# Patient Record
Sex: Female | Born: 1985 | Race: White | Hispanic: No | Marital: Married
Health system: Southern US, Community
[De-identification: ages and names within clinical notes are randomized; demographics above are authoritative.]

---

## 2018-07-31 ENCOUNTER — Emergency Department (HOSPITAL_COMMUNITY): Payer: BC Managed Care – PPO

## 2018-07-31 ENCOUNTER — Other Ambulatory Visit: Payer: Self-pay

## 2018-07-31 ENCOUNTER — Emergency Department (HOSPITAL_COMMUNITY)
Admission: EM | Admit: 2018-07-31 | Discharge: 2018-07-31 | Disposition: A | Discharge status: Home / Self Care | Payer: BC Managed Care – PPO | Attending: Emergency Medicine | Admitting: Emergency Medicine

## 2018-07-31 DIAGNOSIS — Y999 Unspecified external cause status: Secondary | ICD-10-CM | POA: Diagnosis not present

## 2018-07-31 DIAGNOSIS — R0789 Other chest pain: Secondary | ICD-10-CM | POA: Diagnosis present

## 2018-07-31 DIAGNOSIS — Y929 Unspecified place or not applicable: Secondary | ICD-10-CM | POA: Insufficient documentation

## 2018-07-31 DIAGNOSIS — Y9389 Activity, other specified: Secondary | ICD-10-CM | POA: Diagnosis not present

## 2018-07-31 DIAGNOSIS — S63622A Sprain of interphalangeal joint of left thumb, initial encounter: Secondary | ICD-10-CM

## 2018-07-31 DIAGNOSIS — S63631A Sprain of interphalangeal joint of left index finger, initial encounter: Secondary | ICD-10-CM | POA: Insufficient documentation

## 2018-07-31 LAB — POC URINE PREG, ED: Preg Test, Ur: NEGATIVE
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
## 2018-07-31 NOTE — Progress Notes (Signed)
Orthopedic Tech Progress Note Patient Details:  Anne Andersen 06/19/1985 [id number]  Ortho Devices Type of Ortho Device: Thumb velcro splint Ortho Device/Splint Interventions: Application   Post Interventions Patient Tolerated: Well Instructions Provided: Care of device   Saul FordyceJennifer C Amayrani Bennick 07/31/2018, 5:18 PM

## 2018-07-31 NOTE — Discharge Instructions (Signed)
Wear splint until follow up recheck with orthopedics. Apply ice for 20 minutes at a time, elevate hand to help with pain and swelling. Motrin and Tylenol as needed as directed for pain.
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
## 2019-08-21 DIAGNOSIS — Z6825 Body mass index (BMI) 25.0-25.9, adult: Secondary | ICD-10-CM | POA: Diagnosis not present

## 2019-08-21 DIAGNOSIS — N63 Unspecified lump in unspecified breast: Secondary | ICD-10-CM | POA: Diagnosis not present

## 2019-08-25 ENCOUNTER — Other Ambulatory Visit: Payer: Self-pay | Admitting: Obstetrics and Gynecology

## 2019-08-25 DIAGNOSIS — N6320 Unspecified lump in the left breast, unspecified quadrant: Secondary | ICD-10-CM

## 2019-09-02 ENCOUNTER — Ambulatory Visit
Admission: RE | Admit: 2019-09-02 | Discharge: 2019-09-02 | Disposition: A | Discharge status: Home / Self Care | Payer: BC Managed Care – PPO | Source: Ambulatory Visit | Attending: Obstetrics and Gynecology | Admitting: Obstetrics and Gynecology

## 2019-09-02 ENCOUNTER — Other Ambulatory Visit: Payer: Self-pay | Admitting: Obstetrics and Gynecology

## 2019-09-02 ENCOUNTER — Other Ambulatory Visit: Payer: Self-pay

## 2019-09-02 DIAGNOSIS — N6320 Unspecified lump in the left breast, unspecified quadrant: Secondary | ICD-10-CM

## 2019-09-02 DIAGNOSIS — N6489 Other specified disorders of breast: Secondary | ICD-10-CM | POA: Diagnosis not present

## 2019-09-02 DIAGNOSIS — N6451 Induration of breast: Secondary | ICD-10-CM | POA: Diagnosis not present

## 2019-09-02 DIAGNOSIS — R928 Other abnormal and inconclusive findings on diagnostic imaging of breast: Secondary | ICD-10-CM | POA: Diagnosis not present

## 2019-09-09 ENCOUNTER — Ambulatory Visit
Admission: RE | Admit: 2019-09-09 | Discharge: 2019-09-09 | Disposition: A | Discharge status: Home / Self Care | Payer: BC Managed Care – PPO | Source: Ambulatory Visit | Attending: Obstetrics and Gynecology | Admitting: Obstetrics and Gynecology

## 2019-09-09 ENCOUNTER — Other Ambulatory Visit: Payer: Self-pay

## 2019-09-09 DIAGNOSIS — N6320 Unspecified lump in the left breast, unspecified quadrant: Secondary | ICD-10-CM

## 2019-09-09 DIAGNOSIS — D242 Benign neoplasm of left breast: Secondary | ICD-10-CM | POA: Diagnosis not present

## 2019-09-09 DIAGNOSIS — N6341 Unspecified lump in right breast, subareolar: Secondary | ICD-10-CM | POA: Diagnosis not present

## 2019-09-09 DIAGNOSIS — N6324 Unspecified lump in the left breast, lower inner quadrant: Secondary | ICD-10-CM | POA: Diagnosis not present

## 2019-10-19 DIAGNOSIS — M531 Cervicobrachial syndrome: Secondary | ICD-10-CM | POA: Diagnosis not present

## 2019-10-19 DIAGNOSIS — M9901 Segmental and somatic dysfunction of cervical region: Secondary | ICD-10-CM | POA: Diagnosis not present

## 2019-10-19 DIAGNOSIS — M9902 Segmental and somatic dysfunction of thoracic region: Secondary | ICD-10-CM | POA: Diagnosis not present

## 2019-10-19 DIAGNOSIS — M791 Myalgia, unspecified site: Secondary | ICD-10-CM | POA: Diagnosis not present

## 2019-10-26 DIAGNOSIS — M9902 Segmental and somatic dysfunction of thoracic region: Secondary | ICD-10-CM | POA: Diagnosis not present

## 2019-10-26 DIAGNOSIS — M791 Myalgia, unspecified site: Secondary | ICD-10-CM | POA: Diagnosis not present

## 2019-10-26 DIAGNOSIS — M531 Cervicobrachial syndrome: Secondary | ICD-10-CM | POA: Diagnosis not present

## 2019-10-26 DIAGNOSIS — M9901 Segmental and somatic dysfunction of cervical region: Secondary | ICD-10-CM | POA: Diagnosis not present

## 2019-11-09 DIAGNOSIS — M791 Myalgia, unspecified site: Secondary | ICD-10-CM | POA: Diagnosis not present

## 2019-11-09 DIAGNOSIS — M9901 Segmental and somatic dysfunction of cervical region: Secondary | ICD-10-CM | POA: Diagnosis not present

## 2019-11-09 DIAGNOSIS — M9902 Segmental and somatic dysfunction of thoracic region: Secondary | ICD-10-CM | POA: Diagnosis not present

## 2019-11-09 DIAGNOSIS — M531 Cervicobrachial syndrome: Secondary | ICD-10-CM | POA: Diagnosis not present

## 2019-12-04 DIAGNOSIS — M9902 Segmental and somatic dysfunction of thoracic region: Secondary | ICD-10-CM | POA: Diagnosis not present

## 2019-12-04 DIAGNOSIS — M9901 Segmental and somatic dysfunction of cervical region: Secondary | ICD-10-CM | POA: Diagnosis not present

## 2019-12-04 DIAGNOSIS — M791 Myalgia, unspecified site: Secondary | ICD-10-CM | POA: Diagnosis not present

## 2019-12-04 DIAGNOSIS — M531 Cervicobrachial syndrome: Secondary | ICD-10-CM | POA: Diagnosis not present

## 2019-12-31 ENCOUNTER — Ambulatory Visit: Payer: PRIVATE HEALTH INSURANCE | Attending: Internal Medicine

## 2019-12-31 DIAGNOSIS — Z20822 Contact with and (suspected) exposure to covid-19: Secondary | ICD-10-CM

## 2019-12-31 DIAGNOSIS — Z20828 Contact with and (suspected) exposure to other viral communicable diseases: Secondary | ICD-10-CM | POA: Insufficient documentation

## 2020-01-02 LAB — NOVEL CORONAVIRUS, NAA: SARS-CoV-2, NAA: NOT DETECTED

## 2020-01-04 ENCOUNTER — Other Ambulatory Visit: Payer: BC Managed Care – PPO

## 2020-01-11 ENCOUNTER — Ambulatory Visit: Payer: PRIVATE HEALTH INSURANCE | Attending: Internal Medicine

## 2020-01-11 DIAGNOSIS — U071 COVID-19: Secondary | ICD-10-CM | POA: Insufficient documentation

## 2020-01-11 DIAGNOSIS — Z20822 Contact with and (suspected) exposure to covid-19: Secondary | ICD-10-CM

## 2020-01-12 LAB — NOVEL CORONAVIRUS, NAA: SARS-CoV-2, NAA: DETECTED — AB

## 2020-01-25 ENCOUNTER — Other Ambulatory Visit: Payer: Self-pay | Admitting: Obstetrics and Gynecology

## 2020-01-25 DIAGNOSIS — N6452 Nipple discharge: Secondary | ICD-10-CM | POA: Diagnosis not present

## 2020-01-25 DIAGNOSIS — Z6826 Body mass index (BMI) 26.0-26.9, adult: Secondary | ICD-10-CM | POA: Diagnosis not present

## 2020-01-26 ENCOUNTER — Other Ambulatory Visit: Payer: Self-pay | Admitting: Obstetrics and Gynecology

## 2020-01-26 DIAGNOSIS — N6452 Nipple discharge: Secondary | ICD-10-CM

## 2020-01-29 DIAGNOSIS — R947 Abnormal results of other endocrine function studies: Secondary | ICD-10-CM | POA: Diagnosis not present

## 2020-02-15 ENCOUNTER — Ambulatory Visit: Payer: Self-pay

## 2020-02-15 ENCOUNTER — Other Ambulatory Visit: Payer: Self-pay | Admitting: Obstetrics and Gynecology

## 2020-02-15 ENCOUNTER — Ambulatory Visit
Admission: RE | Admit: 2020-02-15 | Discharge: 2020-02-15 | Disposition: A | Discharge status: Home / Self Care | Payer: Self-pay | Source: Ambulatory Visit | Attending: Obstetrics and Gynecology | Admitting: Obstetrics and Gynecology

## 2020-02-15 ENCOUNTER — Ambulatory Visit
Admission: RE | Admit: 2020-02-15 | Discharge: 2020-02-15 | Disposition: A | Discharge status: Home / Self Care | Payer: PRIVATE HEALTH INSURANCE | Source: Ambulatory Visit | Attending: Obstetrics and Gynecology | Admitting: Obstetrics and Gynecology

## 2020-02-15 ENCOUNTER — Other Ambulatory Visit: Payer: Self-pay

## 2020-02-15 DIAGNOSIS — R922 Inconclusive mammogram: Secondary | ICD-10-CM | POA: Diagnosis not present

## 2020-02-15 DIAGNOSIS — N6452 Nipple discharge: Secondary | ICD-10-CM

## 2020-02-15 DIAGNOSIS — N6011 Diffuse cystic mastopathy of right breast: Secondary | ICD-10-CM | POA: Diagnosis not present

## 2020-02-15 DIAGNOSIS — N6041 Mammary duct ectasia of right breast: Secondary | ICD-10-CM | POA: Diagnosis not present

## 2020-02-29 DIAGNOSIS — N6452 Nipple discharge: Secondary | ICD-10-CM | POA: Diagnosis not present

## 2020-02-29 DIAGNOSIS — R5383 Other fatigue: Secondary | ICD-10-CM | POA: Diagnosis not present

## 2020-02-29 DIAGNOSIS — D242 Benign neoplasm of left breast: Secondary | ICD-10-CM | POA: Diagnosis not present

## 2020-02-29 DIAGNOSIS — N898 Other specified noninflammatory disorders of vagina: Secondary | ICD-10-CM | POA: Diagnosis not present

## 2020-02-29 DIAGNOSIS — R635 Abnormal weight gain: Secondary | ICD-10-CM | POA: Diagnosis not present

## 2020-02-29 DIAGNOSIS — L853 Xerosis cutis: Secondary | ICD-10-CM | POA: Diagnosis not present

## 2020-03-03 ENCOUNTER — Other Ambulatory Visit: Payer: Self-pay | Admitting: Surgery

## 2020-03-03 DIAGNOSIS — N6452 Nipple discharge: Secondary | ICD-10-CM

## 2020-03-07 ENCOUNTER — Other Ambulatory Visit: Payer: PRIVATE HEALTH INSURANCE

## 2020-03-15 ENCOUNTER — Inpatient Hospital Stay: Admission: RE | Admit: 2020-03-15 | Payer: PRIVATE HEALTH INSURANCE | Source: Ambulatory Visit

## 2020-04-10 DIAGNOSIS — S8011XA Contusion of right lower leg, initial encounter: Secondary | ICD-10-CM | POA: Diagnosis not present

## 2020-04-14 DIAGNOSIS — N898 Other specified noninflammatory disorders of vagina: Secondary | ICD-10-CM | POA: Diagnosis not present

## 2020-04-14 DIAGNOSIS — Z6826 Body mass index (BMI) 26.0-26.9, adult: Secondary | ICD-10-CM | POA: Diagnosis not present

## 2020-04-14 DIAGNOSIS — N762 Acute vulvitis: Secondary | ICD-10-CM | POA: Diagnosis not present

## 2020-08-11 IMAGING — MG MM BREAST LOCALIZATION CLIP
4 series · 4 of 12 positions shown · non-contrast
Comparison: Previous exam(s).

CLINICAL DATA: Status post ultrasound-guided core needle biopsy of
the recently demonstrated 1.8 cm mass in the 4 o'clock position of
the left breast

EXAM:
DIAGNOSTIC LEFT MAMMOGRAM POST ULTRASOUND BIOPSY

[L ML synth-2D]
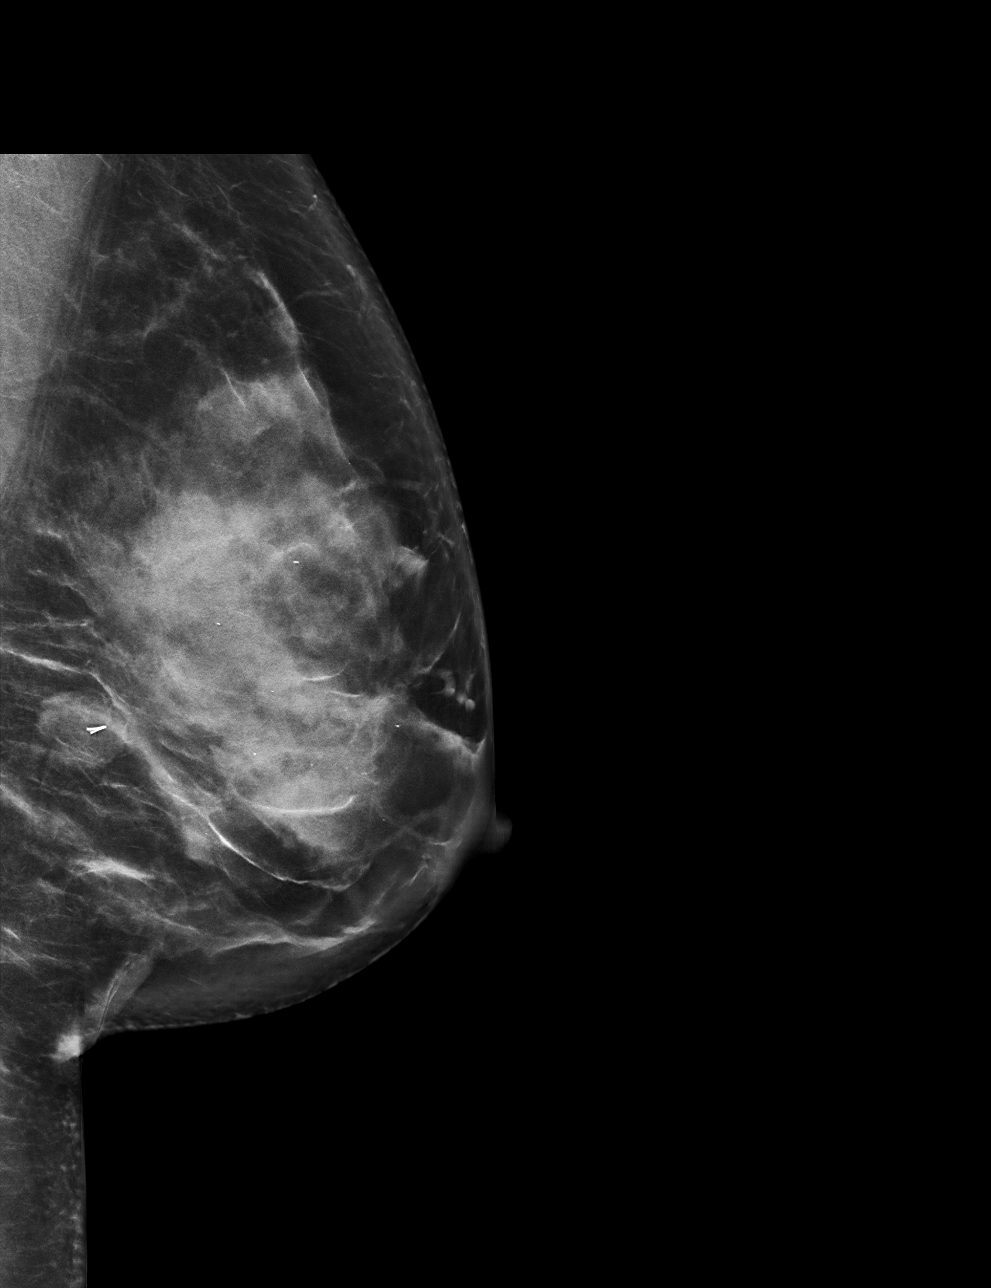

[L CC synth-2D]
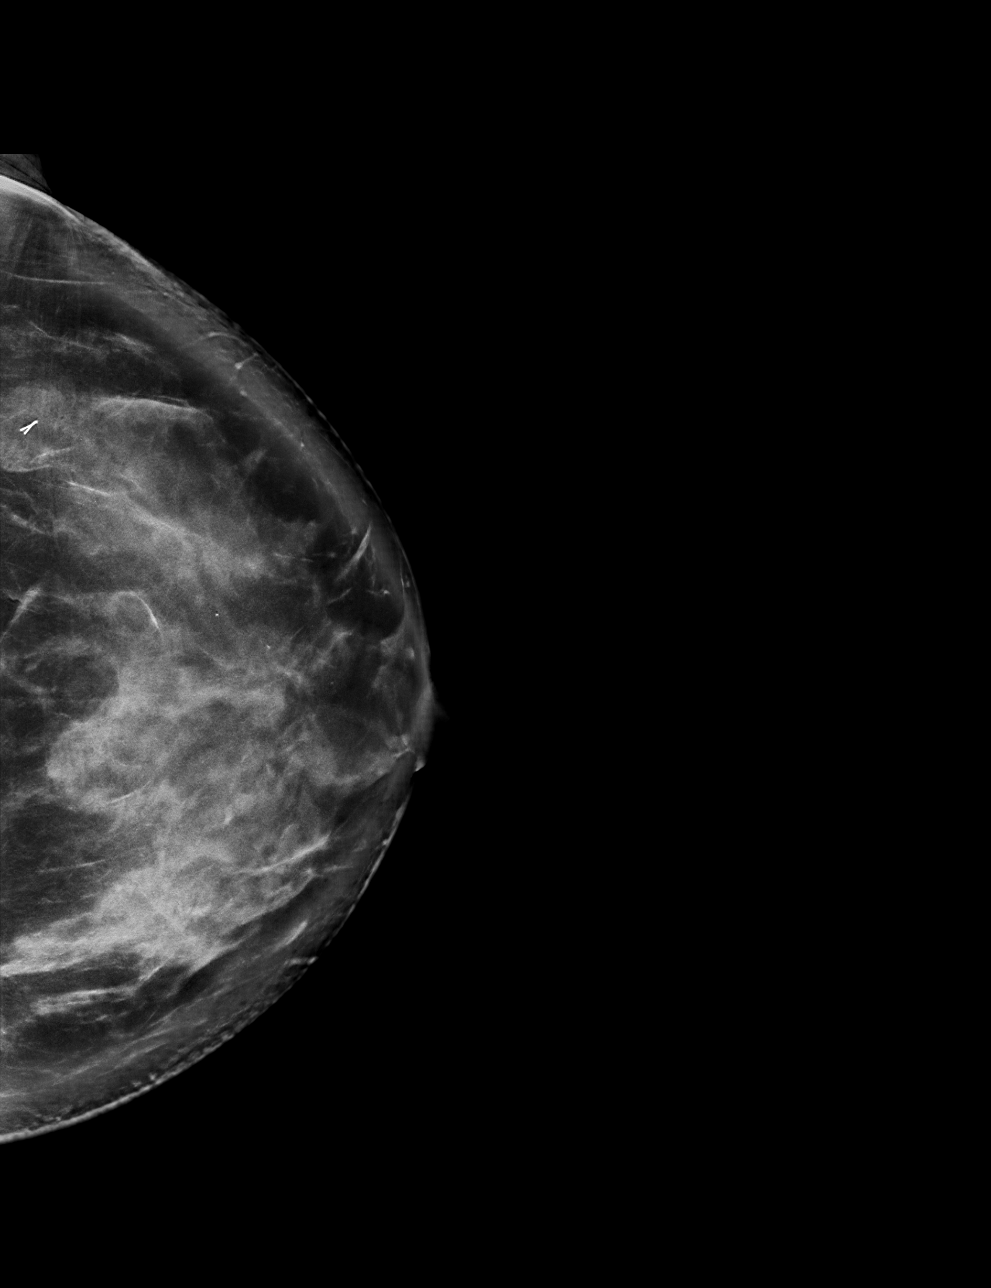

[L CC tomo · tomo slice 48/95.0]
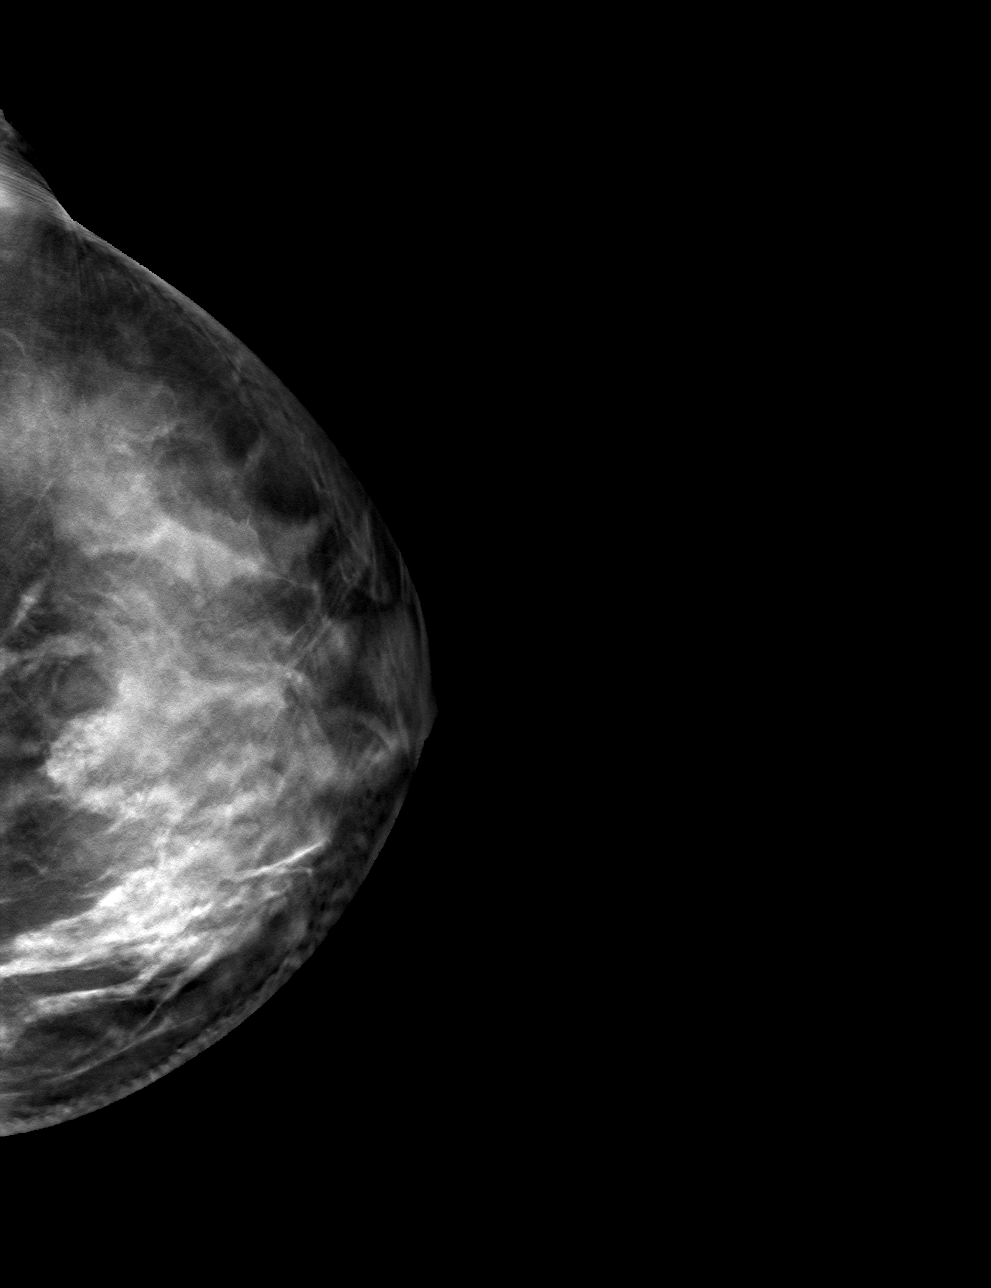

[L ML tomo · tomo slice 44/87.0]
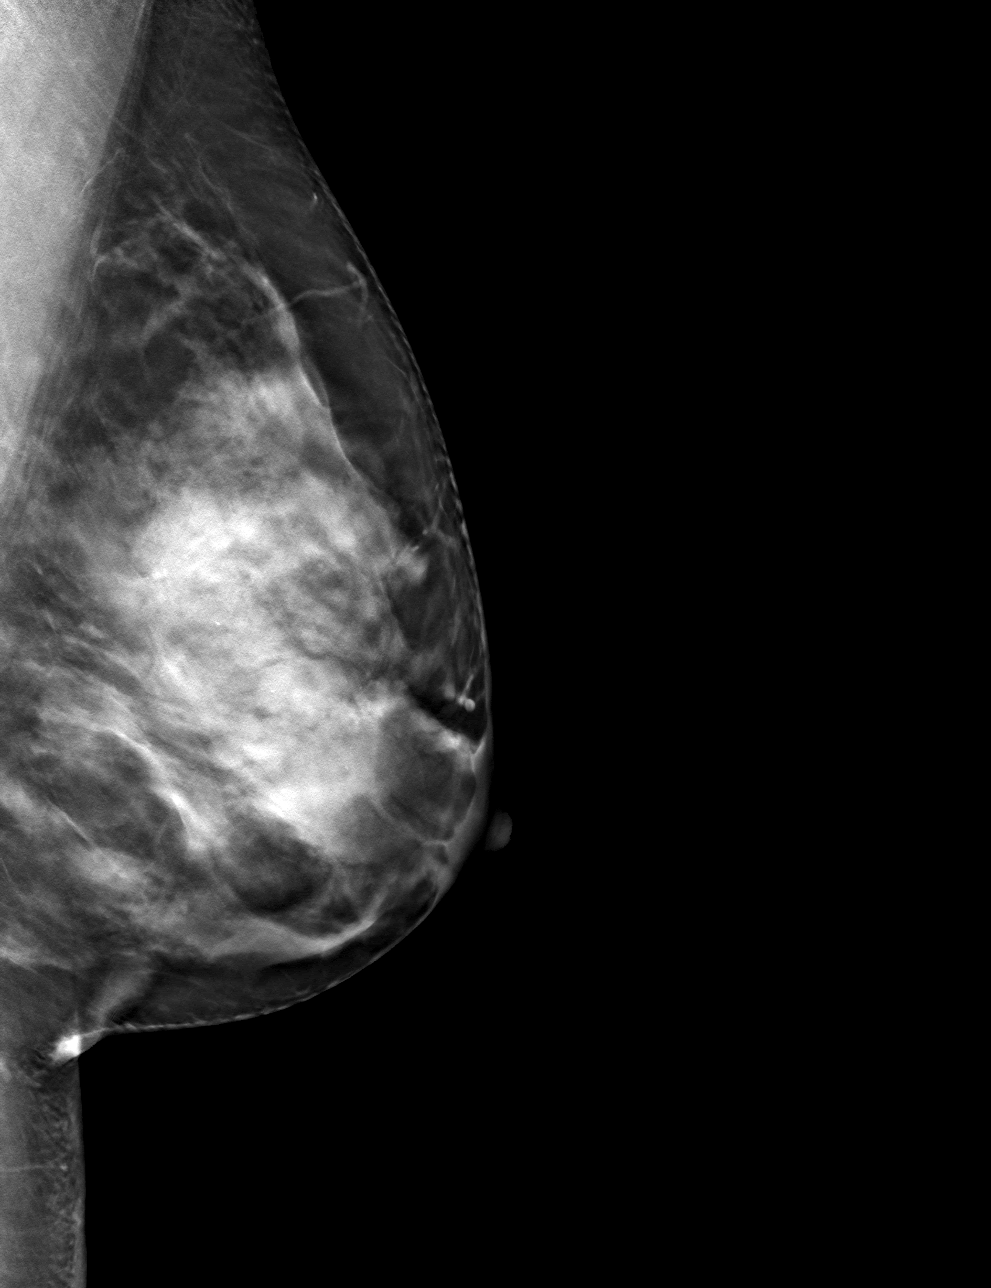

[4 of 12 positions shown; findings below may reference images not displayed]

FINDINGS: Mammographic images were obtained following ultrasound guided biopsy
of the recently demonstrated 1.8 cm mass in the 4 o'clock position
of the left breast. These demonstrate a ribbon shaped biopsy marker
clip within the biopsied mass.
IMPRESSION: Appropriate clip deployment following left breast ultrasound-guided
core needle biopsy.

Final Assessment: Post Procedure Mammograms for Marker Placement

## 2020-08-26 DIAGNOSIS — Z6826 Body mass index (BMI) 26.0-26.9, adult: Secondary | ICD-10-CM | POA: Diagnosis not present

## 2020-08-26 DIAGNOSIS — N644 Mastodynia: Secondary | ICD-10-CM | POA: Diagnosis not present

## 2020-08-29 DIAGNOSIS — M9902 Segmental and somatic dysfunction of thoracic region: Secondary | ICD-10-CM | POA: Diagnosis not present

## 2020-08-29 DIAGNOSIS — M531 Cervicobrachial syndrome: Secondary | ICD-10-CM | POA: Diagnosis not present

## 2020-08-29 DIAGNOSIS — M9901 Segmental and somatic dysfunction of cervical region: Secondary | ICD-10-CM | POA: Diagnosis not present

## 2020-08-29 DIAGNOSIS — M791 Myalgia, unspecified site: Secondary | ICD-10-CM | POA: Diagnosis not present

## 2020-09-02 ENCOUNTER — Other Ambulatory Visit: Payer: Self-pay

## 2020-09-02 ENCOUNTER — Ambulatory Visit
Admission: RE | Admit: 2020-09-02 | Discharge: 2020-09-02 | Disposition: A | Discharge status: Home / Self Care | Payer: PRIVATE HEALTH INSURANCE | Source: Ambulatory Visit | Attending: Obstetrics and Gynecology | Admitting: Obstetrics and Gynecology

## 2020-09-02 DIAGNOSIS — N6452 Nipple discharge: Secondary | ICD-10-CM

## 2020-10-27 DIAGNOSIS — Z20822 Contact with and (suspected) exposure to covid-19: Secondary | ICD-10-CM | POA: Diagnosis not present

## 2020-10-27 DIAGNOSIS — Z03818 Encounter for observation for suspected exposure to other biological agents ruled out: Secondary | ICD-10-CM | POA: Diagnosis not present

## 2020-11-07 DIAGNOSIS — Z131 Encounter for screening for diabetes mellitus: Secondary | ICD-10-CM | POA: Diagnosis not present

## 2020-11-07 DIAGNOSIS — Z13220 Encounter for screening for lipoid disorders: Secondary | ICD-10-CM | POA: Diagnosis not present

## 2020-11-07 DIAGNOSIS — Z1331 Encounter for screening for depression: Secondary | ICD-10-CM | POA: Diagnosis not present

## 2020-11-07 DIAGNOSIS — R5383 Other fatigue: Secondary | ICD-10-CM | POA: Diagnosis not present

## 2020-11-07 DIAGNOSIS — R0602 Shortness of breath: Secondary | ICD-10-CM | POA: Diagnosis not present

## 2020-11-07 DIAGNOSIS — Z1159 Encounter for screening for other viral diseases: Secondary | ICD-10-CM | POA: Diagnosis not present

## 2020-11-07 DIAGNOSIS — E7889 Other lipoprotein metabolism disorders: Secondary | ICD-10-CM | POA: Diagnosis not present

## 2020-11-07 DIAGNOSIS — Z1339 Encounter for screening examination for other mental health and behavioral disorders: Secondary | ICD-10-CM | POA: Diagnosis not present

## 2020-11-07 DIAGNOSIS — E559 Vitamin D deficiency, unspecified: Secondary | ICD-10-CM | POA: Diagnosis not present

## 2020-11-07 DIAGNOSIS — Z111 Encounter for screening for respiratory tuberculosis: Secondary | ICD-10-CM | POA: Diagnosis not present

## 2020-11-07 DIAGNOSIS — Z23 Encounter for immunization: Secondary | ICD-10-CM | POA: Diagnosis not present

## 2020-11-07 DIAGNOSIS — Z0000 Encounter for general adult medical examination without abnormal findings: Secondary | ICD-10-CM | POA: Diagnosis not present

## 2020-11-09 DIAGNOSIS — Z111 Encounter for screening for respiratory tuberculosis: Secondary | ICD-10-CM | POA: Diagnosis not present

## 2020-11-09 DIAGNOSIS — E559 Vitamin D deficiency, unspecified: Secondary | ICD-10-CM | POA: Diagnosis not present

## 2020-11-09 DIAGNOSIS — E789 Disorder of lipoprotein metabolism, unspecified: Secondary | ICD-10-CM | POA: Diagnosis not present

## 2020-11-19 DIAGNOSIS — Z20822 Contact with and (suspected) exposure to covid-19: Secondary | ICD-10-CM | POA: Diagnosis not present

## 2020-11-19 DIAGNOSIS — J069 Acute upper respiratory infection, unspecified: Secondary | ICD-10-CM | POA: Diagnosis not present

## 2021-07-18 ENCOUNTER — Other Ambulatory Visit: Payer: Self-pay | Admitting: Obstetrics and Gynecology

## 2021-07-18 DIAGNOSIS — N63 Unspecified lump in unspecified breast: Secondary | ICD-10-CM

## 2021-09-06 ENCOUNTER — Other Ambulatory Visit: Payer: Self-pay

## 2021-09-06 ENCOUNTER — Ambulatory Visit
Admission: RE | Admit: 2021-09-06 | Discharge: 2021-09-06 | Disposition: A | Discharge status: Home / Self Care | Payer: BC Managed Care – PPO | Source: Ambulatory Visit | Attending: Obstetrics and Gynecology | Admitting: Obstetrics and Gynecology

## 2021-09-06 DIAGNOSIS — N6311 Unspecified lump in the right breast, upper outer quadrant: Secondary | ICD-10-CM | POA: Diagnosis not present

## 2021-09-06 DIAGNOSIS — N63 Unspecified lump in unspecified breast: Secondary | ICD-10-CM

## 2021-11-28 DIAGNOSIS — Z6826 Body mass index (BMI) 26.0-26.9, adult: Secondary | ICD-10-CM | POA: Diagnosis not present

## 2021-11-28 DIAGNOSIS — Z01419 Encounter for gynecological examination (general) (routine) without abnormal findings: Secondary | ICD-10-CM | POA: Diagnosis not present

## 2022-02-13 DIAGNOSIS — F4320 Adjustment disorder, unspecified: Secondary | ICD-10-CM | POA: Diagnosis not present

## 2022-02-13 DIAGNOSIS — Z62811 Personal history of psychological abuse in childhood: Secondary | ICD-10-CM | POA: Diagnosis not present

## 2022-02-28 DIAGNOSIS — F4323 Adjustment disorder with mixed anxiety and depressed mood: Secondary | ICD-10-CM | POA: Diagnosis not present

## 2022-02-28 DIAGNOSIS — Z62811 Personal history of psychological abuse in childhood: Secondary | ICD-10-CM | POA: Diagnosis not present

## 2022-03-16 DIAGNOSIS — Z62811 Personal history of psychological abuse in childhood: Secondary | ICD-10-CM | POA: Diagnosis not present

## 2022-03-16 DIAGNOSIS — F4323 Adjustment disorder with mixed anxiety and depressed mood: Secondary | ICD-10-CM | POA: Diagnosis not present

## 2022-04-11 DIAGNOSIS — J029 Acute pharyngitis, unspecified: Secondary | ICD-10-CM | POA: Diagnosis not present

## 2022-08-08 DIAGNOSIS — M531 Cervicobrachial syndrome: Secondary | ICD-10-CM | POA: Diagnosis not present

## 2022-08-08 DIAGNOSIS — M9903 Segmental and somatic dysfunction of lumbar region: Secondary | ICD-10-CM | POA: Diagnosis not present

## 2022-08-08 DIAGNOSIS — M9902 Segmental and somatic dysfunction of thoracic region: Secondary | ICD-10-CM | POA: Diagnosis not present

## 2022-08-08 DIAGNOSIS — M9901 Segmental and somatic dysfunction of cervical region: Secondary | ICD-10-CM | POA: Diagnosis not present

## 2022-08-13 DIAGNOSIS — S86112A Strain of other muscle(s) and tendon(s) of posterior muscle group at lower leg level, left leg, initial encounter: Secondary | ICD-10-CM | POA: Diagnosis not present

## 2022-09-13 DIAGNOSIS — H9192 Unspecified hearing loss, left ear: Secondary | ICD-10-CM | POA: Diagnosis not present

## 2022-09-13 DIAGNOSIS — H9313 Tinnitus, bilateral: Secondary | ICD-10-CM | POA: Diagnosis not present

## 2022-09-13 DIAGNOSIS — H9042 Sensorineural hearing loss, unilateral, left ear, with unrestricted hearing on the contralateral side: Secondary | ICD-10-CM | POA: Diagnosis not present

## 2022-09-20 DIAGNOSIS — F4323 Adjustment disorder with mixed anxiety and depressed mood: Secondary | ICD-10-CM | POA: Diagnosis not present

## 2022-12-05 DIAGNOSIS — Z124 Encounter for screening for malignant neoplasm of cervix: Secondary | ICD-10-CM | POA: Diagnosis not present

## 2022-12-05 DIAGNOSIS — Z01419 Encounter for gynecological examination (general) (routine) without abnormal findings: Secondary | ICD-10-CM | POA: Diagnosis not present

## 2023-02-12 DIAGNOSIS — Z1330 Encounter for screening examination for mental health and behavioral disorders, unspecified: Secondary | ICD-10-CM | POA: Diagnosis not present

## 2023-02-12 DIAGNOSIS — M84375A Stress fracture, left foot, initial encounter for fracture: Secondary | ICD-10-CM | POA: Diagnosis not present

## 2023-02-12 DIAGNOSIS — R52 Pain, unspecified: Secondary | ICD-10-CM | POA: Diagnosis not present

## 2023-05-08 DIAGNOSIS — F419 Anxiety disorder, unspecified: Secondary | ICD-10-CM | POA: Diagnosis not present

## 2023-05-08 DIAGNOSIS — F411 Generalized anxiety disorder: Secondary | ICD-10-CM | POA: Diagnosis not present

## 2023-08-06 DIAGNOSIS — L820 Inflamed seborrheic keratosis: Secondary | ICD-10-CM | POA: Diagnosis not present

## 2023-08-06 DIAGNOSIS — L821 Other seborrheic keratosis: Secondary | ICD-10-CM | POA: Diagnosis not present

## 2023-08-06 DIAGNOSIS — L814 Other melanin hyperpigmentation: Secondary | ICD-10-CM | POA: Diagnosis not present

## 2023-09-13 DIAGNOSIS — U071 COVID-19: Secondary | ICD-10-CM | POA: Diagnosis not present

## 2023-12-11 DIAGNOSIS — Z13220 Encounter for screening for lipoid disorders: Secondary | ICD-10-CM | POA: Diagnosis not present

## 2023-12-11 DIAGNOSIS — Z1329 Encounter for screening for other suspected endocrine disorder: Secondary | ICD-10-CM | POA: Diagnosis not present

## 2023-12-11 DIAGNOSIS — Z130 Encounter for screening for diseases of the blood and blood-forming organs and certain disorders involving the immune mechanism: Secondary | ICD-10-CM | POA: Diagnosis not present

## 2023-12-11 DIAGNOSIS — Z01419 Encounter for gynecological examination (general) (routine) without abnormal findings: Secondary | ICD-10-CM | POA: Diagnosis not present

## 2024-01-21 DIAGNOSIS — H16432 Localized vascularization of cornea, left eye: Secondary | ICD-10-CM | POA: Diagnosis not present

## 2024-01-21 DIAGNOSIS — H16042 Marginal corneal ulcer, left eye: Secondary | ICD-10-CM | POA: Diagnosis not present

## 2024-02-10 DIAGNOSIS — M9902 Segmental and somatic dysfunction of thoracic region: Secondary | ICD-10-CM | POA: Diagnosis not present

## 2024-02-10 DIAGNOSIS — M9903 Segmental and somatic dysfunction of lumbar region: Secondary | ICD-10-CM | POA: Diagnosis not present

## 2024-02-10 DIAGNOSIS — M9901 Segmental and somatic dysfunction of cervical region: Secondary | ICD-10-CM | POA: Diagnosis not present

## 2024-02-10 DIAGNOSIS — M531 Cervicobrachial syndrome: Secondary | ICD-10-CM | POA: Diagnosis not present

## 2024-02-27 DIAGNOSIS — L089 Local infection of the skin and subcutaneous tissue, unspecified: Secondary | ICD-10-CM | POA: Diagnosis not present

## 2024-02-27 DIAGNOSIS — L02415 Cutaneous abscess of right lower limb: Secondary | ICD-10-CM | POA: Diagnosis not present

## 2024-03-17 DIAGNOSIS — E559 Vitamin D deficiency, unspecified: Secondary | ICD-10-CM | POA: Diagnosis not present

## 2024-05-11 DIAGNOSIS — Z6829 Body mass index (BMI) 29.0-29.9, adult: Secondary | ICD-10-CM | POA: Diagnosis not present

## 2024-05-11 DIAGNOSIS — L299 Pruritus, unspecified: Secondary | ICD-10-CM | POA: Diagnosis not present

## 2024-05-11 DIAGNOSIS — J309 Allergic rhinitis, unspecified: Secondary | ICD-10-CM | POA: Diagnosis not present

## 2024-05-28 DIAGNOSIS — M9903 Segmental and somatic dysfunction of lumbar region: Secondary | ICD-10-CM | POA: Diagnosis not present

## 2024-05-28 DIAGNOSIS — M9902 Segmental and somatic dysfunction of thoracic region: Secondary | ICD-10-CM | POA: Diagnosis not present

## 2024-05-28 DIAGNOSIS — M531 Cervicobrachial syndrome: Secondary | ICD-10-CM | POA: Diagnosis not present

## 2024-05-28 DIAGNOSIS — M9901 Segmental and somatic dysfunction of cervical region: Secondary | ICD-10-CM | POA: Diagnosis not present

## 2024-09-11 DIAGNOSIS — M9901 Segmental and somatic dysfunction of cervical region: Secondary | ICD-10-CM | POA: Diagnosis not present

## 2024-09-11 DIAGNOSIS — M531 Cervicobrachial syndrome: Secondary | ICD-10-CM | POA: Diagnosis not present

## 2024-09-11 DIAGNOSIS — M9904 Segmental and somatic dysfunction of sacral region: Secondary | ICD-10-CM | POA: Diagnosis not present

## 2024-09-11 DIAGNOSIS — M9902 Segmental and somatic dysfunction of thoracic region: Secondary | ICD-10-CM | POA: Diagnosis not present

## 2024-09-11 DIAGNOSIS — M9903 Segmental and somatic dysfunction of lumbar region: Secondary | ICD-10-CM | POA: Diagnosis not present

## 2024-12-16 DIAGNOSIS — Z01419 Encounter for gynecological examination (general) (routine) without abnormal findings: Secondary | ICD-10-CM | POA: Diagnosis not present

## 2024-12-16 DIAGNOSIS — Z1231 Encounter for screening mammogram for malignant neoplasm of breast: Secondary | ICD-10-CM | POA: Diagnosis not present

## 2024-12-22 ENCOUNTER — Other Ambulatory Visit: Payer: Self-pay | Admitting: Obstetrics and Gynecology

## 2024-12-22 DIAGNOSIS — R928 Other abnormal and inconclusive findings on diagnostic imaging of breast: Secondary | ICD-10-CM

## 2025-01-01 ENCOUNTER — Other Ambulatory Visit: Payer: Self-pay | Admitting: Medical Genetics

## 2025-01-08 ENCOUNTER — Other Ambulatory Visit: Payer: Self-pay | Admitting: Obstetrics and Gynecology

## 2025-01-08 ENCOUNTER — Ambulatory Visit
Admission: RE | Admit: 2025-01-08 | Discharge: 2025-01-08 | Disposition: A | Source: Ambulatory Visit | Attending: Obstetrics and Gynecology | Admitting: Obstetrics and Gynecology

## 2025-01-08 DIAGNOSIS — R928 Other abnormal and inconclusive findings on diagnostic imaging of breast: Secondary | ICD-10-CM

## 2025-01-08 DIAGNOSIS — N6489 Other specified disorders of breast: Secondary | ICD-10-CM

## 2025-01-08 DIAGNOSIS — N6002 Solitary cyst of left breast: Secondary | ICD-10-CM

## 2025-01-14 ENCOUNTER — Other Ambulatory Visit (HOSPITAL_COMMUNITY)

## 2025-01-19 ENCOUNTER — Inpatient Hospital Stay
Admission: RE | Admit: 2025-01-19 | Discharge: 2025-01-19 | Disposition: A | Source: Ambulatory Visit | Attending: Obstetrics and Gynecology | Admitting: Obstetrics and Gynecology

## 2025-01-19 ENCOUNTER — Ambulatory Visit
Admission: RE | Admit: 2025-01-19 | Discharge: 2025-01-19 | Disposition: A | Source: Ambulatory Visit | Attending: Obstetrics and Gynecology | Admitting: Obstetrics and Gynecology

## 2025-01-19 ENCOUNTER — Other Ambulatory Visit: Payer: Self-pay | Admitting: Obstetrics and Gynecology

## 2025-01-19 DIAGNOSIS — N6002 Solitary cyst of left breast: Secondary | ICD-10-CM

## 2025-01-19 DIAGNOSIS — N6489 Other specified disorders of breast: Secondary | ICD-10-CM

## 2025-01-28 ENCOUNTER — Ambulatory Visit
Admission: RE | Admit: 2025-01-28 | Discharge: 2025-01-28 | Disposition: A | Discharge status: Home / Self Care | Source: Ambulatory Visit | Attending: Obstetrics and Gynecology | Admitting: Obstetrics and Gynecology

## 2025-01-28 ENCOUNTER — Other Ambulatory Visit

## 2025-01-28 DIAGNOSIS — N6489 Other specified disorders of breast: Secondary | ICD-10-CM

## 2025-02-01 LAB — SURGICAL PATHOLOGY
